# Patient Record
Sex: Female | Born: 1940 | Race: White | Hispanic: No | Smoking: Former smoker
Health system: Southern US, Community
[De-identification: ages and names within clinical notes are randomized; demographics above are authoritative.]

## PROBLEM LIST (undated history)

## (undated) DIAGNOSIS — I10 Essential (primary) hypertension: Secondary | ICD-10-CM

## (undated) DIAGNOSIS — I4891 Unspecified atrial fibrillation: Secondary | ICD-10-CM

## (undated) DIAGNOSIS — J449 Chronic obstructive pulmonary disease, unspecified: Secondary | ICD-10-CM

## (undated) DIAGNOSIS — K219 Gastro-esophageal reflux disease without esophagitis: Secondary | ICD-10-CM

## (undated) DIAGNOSIS — I2510 Atherosclerotic heart disease of native coronary artery without angina pectoris: Secondary | ICD-10-CM

## (undated) HISTORY — PX: BILATERAL CARPAL TUNNEL RELEASE: SHX6508

---

## 1977-05-11 HISTORY — PX: ABDOMINAL HYSTERECTOMY: SHX81

## 2015-02-28 ENCOUNTER — Emergency Department (HOSPITAL_BASED_OUTPATIENT_CLINIC_OR_DEPARTMENT_OTHER)
Admission: EM | Admit: 2015-02-28 | Discharge: 2015-02-28 | Disposition: A | Discharge status: Home / Self Care | Payer: Medicare Other | Attending: Emergency Medicine | Admitting: Emergency Medicine

## 2015-02-28 ENCOUNTER — Encounter (HOSPITAL_BASED_OUTPATIENT_CLINIC_OR_DEPARTMENT_OTHER): Payer: Self-pay | Admitting: *Deleted

## 2015-02-28 ENCOUNTER — Emergency Department (HOSPITAL_BASED_OUTPATIENT_CLINIC_OR_DEPARTMENT_OTHER): Payer: Medicare Other

## 2015-02-28 DIAGNOSIS — R142 Eructation: Secondary | ICD-10-CM

## 2015-02-28 DIAGNOSIS — Z7901 Long term (current) use of anticoagulants: Secondary | ICD-10-CM | POA: Insufficient documentation

## 2015-02-28 DIAGNOSIS — R002 Palpitations: Secondary | ICD-10-CM | POA: Diagnosis not present

## 2015-02-28 DIAGNOSIS — I2510 Atherosclerotic heart disease of native coronary artery without angina pectoris: Secondary | ICD-10-CM | POA: Diagnosis not present

## 2015-02-28 DIAGNOSIS — I10 Essential (primary) hypertension: Secondary | ICD-10-CM | POA: Diagnosis not present

## 2015-02-28 DIAGNOSIS — Z87891 Personal history of nicotine dependence: Secondary | ICD-10-CM | POA: Diagnosis not present

## 2015-02-28 DIAGNOSIS — J449 Chronic obstructive pulmonary disease, unspecified: Secondary | ICD-10-CM | POA: Diagnosis not present

## 2015-02-28 DIAGNOSIS — Z7951 Long term (current) use of inhaled steroids: Secondary | ICD-10-CM | POA: Insufficient documentation

## 2015-02-28 DIAGNOSIS — Z79899 Other long term (current) drug therapy: Secondary | ICD-10-CM | POA: Insufficient documentation

## 2015-02-28 DIAGNOSIS — K219 Gastro-esophageal reflux disease without esophagitis: Secondary | ICD-10-CM | POA: Insufficient documentation

## 2015-02-28 HISTORY — DX: Unspecified atrial fibrillation: I48.91

## 2015-02-28 HISTORY — DX: Chronic obstructive pulmonary disease, unspecified: J44.9

## 2015-02-28 HISTORY — DX: Essential (primary) hypertension: I10

## 2015-02-28 HISTORY — DX: Atherosclerotic heart disease of native coronary artery without angina pectoris: I25.10

## 2015-02-28 HISTORY — DX: Gastro-esophageal reflux disease without esophagitis: K21.9

## 2015-02-28 LAB — COMPREHENSIVE METABOLIC PANEL
ALT: 16 U/L (ref 14–54)
ANION GAP: 4 — AB (ref 5–15)
AST: 19 U/L (ref 15–41)
Albumin: 4 g/dL (ref 3.5–5.0)
Alkaline Phosphatase: 70 U/L (ref 38–126)
BILIRUBIN TOTAL: 0.6 mg/dL (ref 0.3–1.2)
BUN: 15 mg/dL (ref 6–20)
CALCIUM: 8.9 mg/dL (ref 8.9–10.3)
CO2: 27 mmol/L (ref 22–32)
Chloride: 109 mmol/L (ref 101–111)
Creatinine, Ser: 0.88 mg/dL (ref 0.44–1.00)
GFR calc non Af Amer: >60 mL/min (ref >60)
Glucose, Bld: 102 mg/dL — ABNORMAL HIGH (ref 65–99)
POTASSIUM: 4 mmol/L (ref 3.5–5.1)
Sodium: 140 mmol/L (ref 135–145)
TOTAL PROTEIN: 6.8 g/dL (ref 6.5–8.1)

## 2015-02-28 LAB — CBC WITH DIFFERENTIAL/PLATELET
BASOS PCT: 1 %
Basophils Absolute: 0 10*3/uL (ref 0.0–0.1)
Eosinophils Absolute: 0.1 10*3/uL (ref 0.0–0.7)
Eosinophils Relative: 2 %
HEMATOCRIT: 38.5 % (ref 36.0–46.0)
Hemoglobin: 12.8 g/dL (ref 12.0–15.0)
LYMPHS ABS: 1.3 10*3/uL (ref 0.7–4.0)
Lymphocytes Relative: 34 %
MCH: 31.1 pg (ref 26.0–34.0)
MCHC: 33.2 g/dL (ref 30.0–36.0)
MCV: 93.7 fL (ref 78.0–100.0)
MONO ABS: 0.4 10*3/uL (ref 0.1–1.0)
MONOS PCT: 11 %
Neutro Abs: 2.1 10*3/uL (ref 1.7–7.7)
Neutrophils Relative %: 52 %
Platelets: 259 10*3/uL (ref 150–400)
RBC: 4.11 MIL/uL (ref 3.87–5.11)
RDW: 13 % (ref 11.5–15.5)
WBC: 3.9 10*3/uL — ABNORMAL LOW (ref 4.0–10.5)

## 2015-02-28 LAB — BRAIN NATRIURETIC PEPTIDE: B NATRIURETIC PEPTIDE 5: 95.4 pg/mL (ref 0.0–100.0)

## 2015-02-28 LAB — TROPONIN I: Troponin I: <0.03 ng/mL (ref <0.031)

## 2015-02-28 LAB — LIPASE, BLOOD: LIPASE: 27 U/L (ref 11–51)

## 2015-02-28 MED ORDER — GI COCKTAIL ~~LOC~~
30.0000 mL | Freq: Once | ORAL | Status: AC
  Administered 2015-02-28: 30 mL via ORAL
  Filled 2015-02-28: qty 30

## 2015-02-28 MED ORDER — ASPIRIN 325 MG PO TABS
325.0000 mg | ORAL_TABLET | Freq: Once | ORAL | Status: AC
  Administered 2015-02-28: 325 mg via ORAL
  Filled 2015-02-28: qty 1

## 2015-02-28 NOTE — ED Notes (Signed)
MD at bedside. 

## 2015-02-28 NOTE — ED Notes (Signed)
Pt given water to take her home medications. Okayed with Dr Littie DeedsGentry

## 2015-02-28 NOTE — ED Notes (Signed)
Patient transported to x-ray. ?

## 2015-02-28 NOTE — ED Notes (Addendum)
Patient states she woke up this morning and checked her blood pressure and it was elevated 168 /109, which is associated with fatigue and sob.  History of afib and copd, which she is treated for the same.  States she feels like her heart beats really fast and she can feel it in her chest.  Also, has acid reflux and is having more frequent belching. C/o sinus congestion and a headache.

## 2015-02-28 NOTE — ED Notes (Signed)
D/c home with family to drive. No new prescriptions

## 2015-02-28 NOTE — Discharge Instructions (Signed)

## 2015-02-28 NOTE — ED Provider Notes (Signed)
CSN: [id number]     Arrival date & time 02/28/15  4098 History   First MD Initiated Contact with Patient 02/28/15 0912     Chief Complaint  Patient presents with  . Hypertension     (Consider location/radiation/quality/duration/timing/severity/associated sxs/prior Treatment) Patient is a 74 y.o. female presenting with hypertension and palpitations.  Hypertension  Palpitations Palpitations quality:  Irregular Onset quality:  Sudden Duration:  2 weeks Timing:  Intermittent Progression:  Unchanged Chronicity:  Recurrent Context comment:  Ho PAF, typically occurs only at night or when resting Relieved by:  Nothing Worsened by:  Nothing Associated symptoms comment:  Belching, no dyspnea, leg swelling, or chest pain   Past Medical History  Diagnosis Date  . COPD (chronic obstructive pulmonary disease) (HCC)   . Coronary artery disease   . Atrial fibrillation (HCC)   . Hypertension   . GERD (gastroesophageal reflux disease)    Past Surgical History  Procedure Laterality Date  . Bilateral carpal tunnel release    . Abdominal hysterectomy  1979    Partial   No family history on file. Social History  Substance Use Topics  . Smoking status: Former Smoker    Quit date: 02/28/1984  . Smokeless tobacco: Never Used  . Alcohol Use: No   OB History    No data available     Review of Systems  Cardiovascular: Positive for palpitations.  All other systems reviewed and are negative.     Allergies  Other  Home Medications   Prior to Admission medications   Medication Sig Start Date End Date Taking? Authorizing Provider  albuterol (PROVENTIL HFA;VENTOLIN HFA) 108 (90 BASE) MCG/ACT inhaler Inhale into the lungs every 6 (six) hours as needed for wheezing or shortness of breath.   Yes Historical Provider, MD  cloNIDine (CATAPRES) 0.1 MG tablet Take 0.2 mg by mouth 2 (two) times daily.   Yes Historical Provider, MD  diltiazem (DILACOR XR) 120 MG 24 hr capsule Take 120 mg by  mouth daily.   Yes Historical Provider, MD  doxazosin (CARDURA) 2 MG tablet Take 2 mg by mouth daily.   Yes Historical Provider, MD  flecainide (TAMBOCOR) 100 MG tablet Take 100 mg by mouth 2 (two) times daily.   Yes Historical Provider, MD  Fluticasone-Salmeterol (ADVAIR) 100-50 MCG/DOSE AEPB Inhale 1 puff into the lungs daily.   Yes Historical Provider, MD  latanoprost (XALATAN) 0.005 % ophthalmic solution Place 1 drop into both eyes at bedtime.   Yes Historical Provider, MD  losartan (COZAAR) 100 MG tablet Take 100 mg by mouth daily.   Yes Historical Provider, MD  pantoprazole (PROTONIX) 40 MG tablet Take 40 mg by mouth daily.   Yes Historical Provider, MD  pravastatin (PRAVACHOL) 20 MG tablet Take 20 mg by mouth daily.   Yes Historical Provider, MD  warfarin (COUMADIN) 5 MG tablet Take 5 mg by mouth daily. Sun 7.5mg ; Mon ; Tues 7.5mg ; Wed ; Thu 7.5mg ; Fri ; Sat 7.5mg    Yes Historical Provider, MD   BP 124/67 mmHg  Pulse 67  Temp(Src) 97.8 F (36.6 C) (Oral)  Resp 17  Ht  (1.651 m)  Wt 212 lb (96.163 kg)  BMI 35.28 kg/m2  SpO2 97% Physical Exam  Constitutional: She is oriented to person, place, and time. She appears well-developed and well-nourished.  HENT:  Head: Normocephalic and atraumatic.  Right Ear: External ear normal.  Left Ear: External ear normal.  Eyes: Conjunctivae and EOM are normal. Pupils are equal, round, and reactive  to light.  Neck: Normal range of motion. Neck supple.  Cardiovascular: Normal rate, regular rhythm, normal heart sounds and intact distal pulses.   Pulmonary/Chest: Effort normal and breath sounds normal.  Abdominal: Soft. Bowel sounds are normal. There is no tenderness.  Musculoskeletal: Normal range of motion.       Right lower leg: She exhibits edema (1+). She exhibits no tenderness.       Left lower leg: She exhibits edema (1+). She exhibits no tenderness.  Neurological: She is alert and oriented to person, place, and time.  Skin:  Skin is warm and dry.  Vitals reviewed.   ED Course  Procedures (including critical care time) Labs Review Labs Reviewed  CBC WITH DIFFERENTIAL/PLATELET - Abnormal; Notable for the following:    WBC 3.9 (*)    All other components within normal limits  COMPREHENSIVE METABOLIC PANEL - Abnormal; Notable for the following:    Glucose, Bld 102 (*)    Anion gap 4 (*)    All other components within normal limits  LIPASE, BLOOD  TROPONIN I  BRAIN NATRIURETIC PEPTIDE    Imaging Review Dg Chest 2 View  02/28/2015  CLINICAL DATA:  A-fib, elev bp, sob since last night. EXAM: CHEST  2 VIEW COMPARISON:  None. FINDINGS: Mild hyperinflation. Numerous leads and wires project over the chest. Midline trachea. Normal heart size. Mildly tortuous thoracic aorta. Chart pleura Clear lungs. No congestive failure. IMPRESSION: No acute cardiopulmonary disease. Electronically Signed   By: Jeronimo GreavesKyle  Talbot M.D.   On: 02/28/2015 10:04   I have personally reviewed and evaluated these images and lab results as part of my medical decision-making.   EKG Interpretation   Date/Time:  Thursday February 28 2015 09:09:24 EDT Ventricular Rate:  70 PR Interval:  260 QRS Duration: 112 QT Interval:  412 QTC Calculation: 444 R Axis:   21 Text Interpretation:  Sinus rhythm with 1st degree A-V block Otherwise  normal ECG No old tracing to compare Confirmed by Mirian MoGentry, Matthew [phone]([id number])  on 02/28/2015 9:12:42 AM      MDM   Final diagnoses:  Essential hypertension  Belching  Palpitations    74 y.o. female with pertinent PMH of COPD, CAD, HTN, PAF presents with palpitations as above.  No dyspnea, chest pain, nausea, or vomiting, however pt does endorse belching and relief when sitting up.  On arrival, mild symptoms present, but vastly better compared to baseline.  Exam as above, no leg tenderness or asymmetry, lungs CTAB.  Wu as above, no new symptoms in 6 hours.  Symptoms relieved by GI cocktail.  Discussed broad  differential including GERD, arrhythmia (PAF or other), and other emergent etiology.  Doubt PE given nature of symptoms and exam, and no signs of easy bleeding, pt is compliant with medications.  DC home to fu with cardiology.    I have reviewed all laboratory and imaging studies if ordered as above  1. Essential hypertension   2. Belching   3. Palpitations         Mirian MoMatthew Gentry, MD 02/28/15 1131

## 2016-12-04 IMAGING — DX DG CHEST 2V
2 series · 2 of 2 positions shown · non-contrast
Comparison: None.

CLINICAL DATA: A-fib, elev bp, sob since last night.

EXAM:
CHEST  2 VIEW

[chest pa]
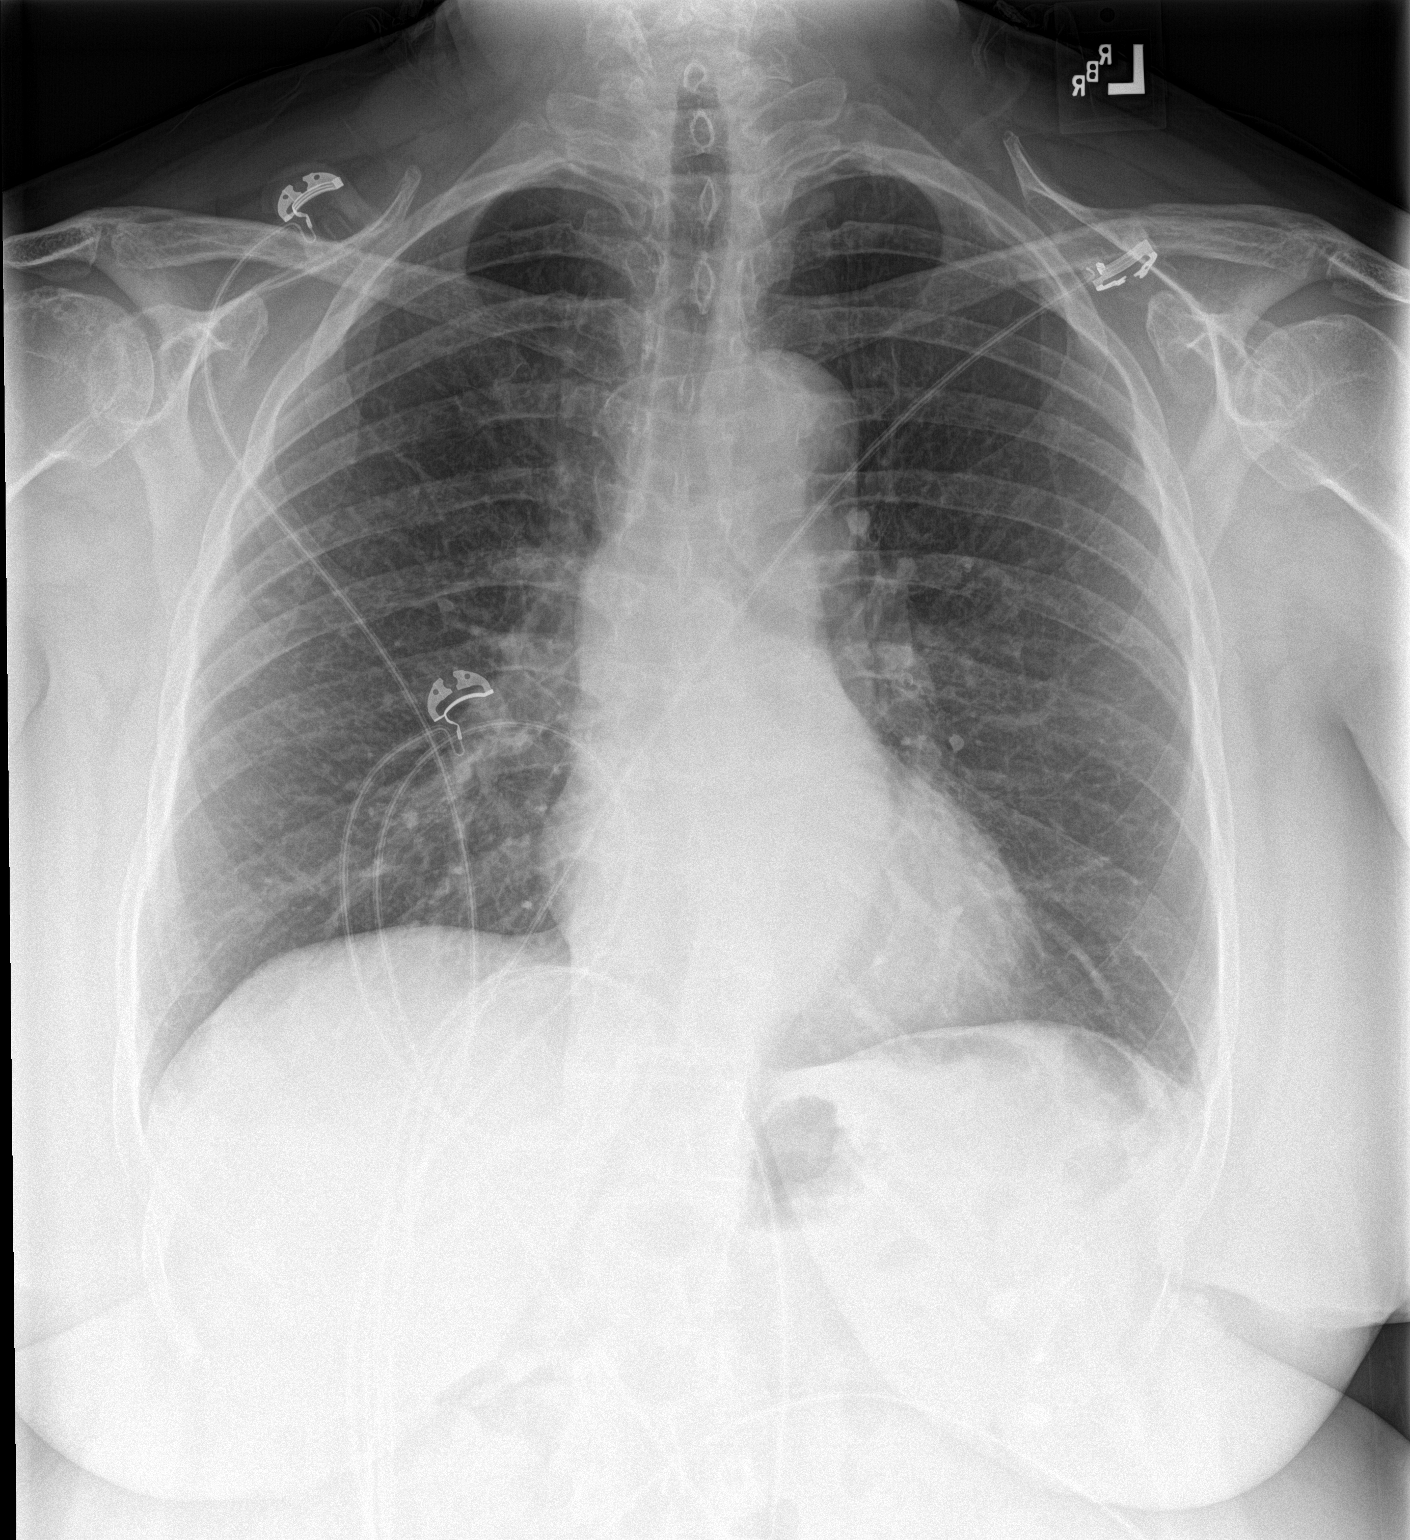

[chest lat]
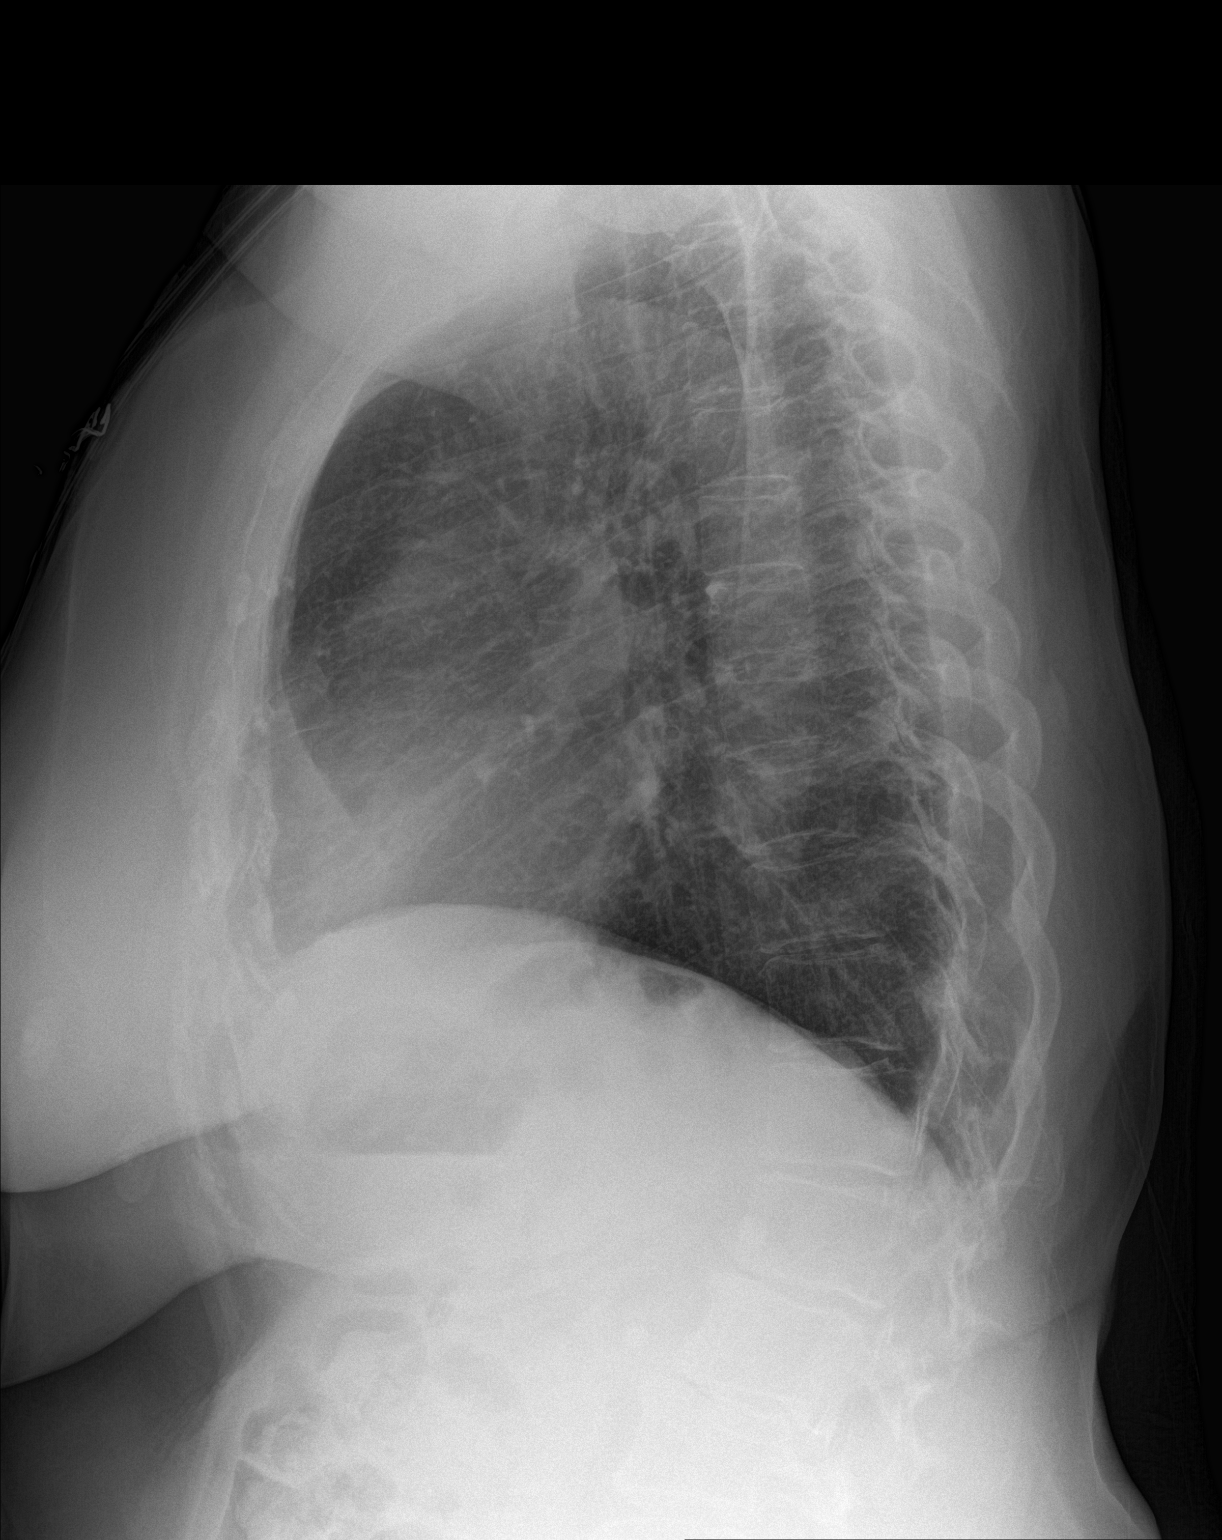

[2 of 2 positions shown; findings below may reference images not displayed]

FINDINGS: Mild hyperinflation. Numerous leads and wires project over the
chest. Midline trachea. Normal heart size. Mildly tortuous thoracic
aorta. Chart pleura Clear lungs. No congestive failure.
IMPRESSION: No acute cardiopulmonary disease.
